# Patient Record
Sex: Female | Born: 1935 | Race: White | Hispanic: No | Marital: Married | ZIP: H3Z | Smoking: Never smoker
Health system: Southern US, Community
[De-identification: ages and names within clinical notes are randomized; demographics above are authoritative.]

## PROBLEM LIST (undated history)

## (undated) DIAGNOSIS — E7800 Pure hypercholesterolemia, unspecified: Secondary | ICD-10-CM

## (undated) DIAGNOSIS — G459 Transient cerebral ischemic attack, unspecified: Secondary | ICD-10-CM

## (undated) DIAGNOSIS — K219 Gastro-esophageal reflux disease without esophagitis: Secondary | ICD-10-CM

## (undated) DIAGNOSIS — I10 Essential (primary) hypertension: Secondary | ICD-10-CM

---

## 2014-04-15 ENCOUNTER — Encounter (HOSPITAL_BASED_OUTPATIENT_CLINIC_OR_DEPARTMENT_OTHER): Payer: Self-pay | Admitting: Emergency Medicine

## 2014-04-15 ENCOUNTER — Emergency Department (HOSPITAL_BASED_OUTPATIENT_CLINIC_OR_DEPARTMENT_OTHER)
Admission: EM | Admit: 2014-04-15 | Discharge: 2014-04-15 | Disposition: A | Discharge status: Home / Self Care | Payer: PRIVATE HEALTH INSURANCE | Attending: Emergency Medicine | Admitting: Emergency Medicine

## 2014-04-15 ENCOUNTER — Emergency Department (HOSPITAL_BASED_OUTPATIENT_CLINIC_OR_DEPARTMENT_OTHER): Payer: PRIVATE HEALTH INSURANCE

## 2014-04-15 ENCOUNTER — Other Ambulatory Visit: Payer: Self-pay

## 2014-04-15 DIAGNOSIS — Z8673 Personal history of transient ischemic attack (TIA), and cerebral infarction without residual deficits: Secondary | ICD-10-CM | POA: Diagnosis not present

## 2014-04-15 DIAGNOSIS — Z79899 Other long term (current) drug therapy: Secondary | ICD-10-CM | POA: Insufficient documentation

## 2014-04-15 DIAGNOSIS — Z862 Personal history of diseases of the blood and blood-forming organs and certain disorders involving the immune mechanism: Secondary | ICD-10-CM | POA: Insufficient documentation

## 2014-04-15 DIAGNOSIS — I10 Essential (primary) hypertension: Secondary | ICD-10-CM | POA: Diagnosis not present

## 2014-04-15 DIAGNOSIS — R0602 Shortness of breath: Secondary | ICD-10-CM | POA: Diagnosis not present

## 2014-04-15 DIAGNOSIS — R110 Nausea: Secondary | ICD-10-CM | POA: Insufficient documentation

## 2014-04-15 DIAGNOSIS — Z7902 Long term (current) use of antithrombotics/antiplatelets: Secondary | ICD-10-CM | POA: Insufficient documentation

## 2014-04-15 DIAGNOSIS — R079 Chest pain, unspecified: Secondary | ICD-10-CM | POA: Diagnosis not present

## 2014-04-15 DIAGNOSIS — Z8719 Personal history of other diseases of the digestive system: Secondary | ICD-10-CM | POA: Diagnosis not present

## 2014-04-15 DIAGNOSIS — Z8639 Personal history of other endocrine, nutritional and metabolic disease: Secondary | ICD-10-CM | POA: Insufficient documentation

## 2014-04-15 HISTORY — DX: Essential (primary) hypertension: I10

## 2014-04-15 HISTORY — DX: Gastro-esophageal reflux disease without esophagitis: K21.9

## 2014-04-15 HISTORY — DX: Transient cerebral ischemic attack, unspecified: G45.9

## 2014-04-15 HISTORY — DX: Pure hypercholesterolemia, unspecified: E78.00

## 2014-04-15 LAB — CBC WITH DIFFERENTIAL/PLATELET
Basophils Absolute: 0 10*3/uL (ref 0.0–0.1)
Basophils Relative: 1 % (ref 0–1)
Eosinophils Absolute: 0.3 10*3/uL (ref 0.0–0.7)
Eosinophils Relative: 3 % (ref 0–5)
HCT: 43.1 % (ref 36.0–46.0)
HEMOGLOBIN: 14.5 g/dL (ref 12.0–15.0)
LYMPHS PCT: 22 % (ref 12–46)
Lymphs Abs: 1.9 10*3/uL (ref 0.7–4.0)
MCH: 30.2 pg (ref 26.0–34.0)
MCHC: 33.6 g/dL (ref 30.0–36.0)
MCV: 89.8 fL (ref 78.0–100.0)
Monocytes Absolute: 0.6 10*3/uL (ref 0.1–1.0)
Monocytes Relative: 7 % (ref 3–12)
NEUTROS ABS: 5.8 10*3/uL (ref 1.7–7.7)
Neutrophils Relative %: 68 % (ref 43–77)
Platelets: 243 10*3/uL (ref 150–400)
RBC: 4.8 MIL/uL (ref 3.87–5.11)
RDW: 14 % (ref 11.5–15.5)
WBC: 8.5 10*3/uL (ref 4.0–10.5)

## 2014-04-15 LAB — BASIC METABOLIC PANEL
BUN: 20 mg/dL (ref 6–23)
CO2: 24 mEq/L (ref 19–32)
CREATININE: 0.8 mg/dL (ref 0.50–1.10)
Calcium: 10 mg/dL (ref 8.4–10.5)
Chloride: 103 mEq/L (ref 96–112)
GFR calc Af Amer: 81 mL/min — ABNORMAL LOW (ref >90)
GFR calc non Af Amer: 70 mL/min — ABNORMAL LOW (ref >90)
Glucose, Bld: 89 mg/dL (ref 70–99)
Potassium: 4.1 mEq/L (ref 3.7–5.3)
Sodium: 140 mEq/L (ref 137–147)

## 2014-04-15 LAB — TROPONIN I
Troponin I: <0.3 ng/mL (ref <0.30)
Troponin I: <0.3 ng/mL (ref <0.30)

## 2014-04-15 MED ORDER — NITROGLYCERIN 0.4 MG SL SUBL
0.4000 mg | SUBLINGUAL_TABLET | SUBLINGUAL | Status: DC | PRN
  Administered 2014-04-15: 0.4 mg via SUBLINGUAL
  Filled 2014-04-15: qty 1

## 2014-04-15 MED ORDER — ASPIRIN 325 MG PO TABS
325.0000 mg | ORAL_TABLET | Freq: Once | ORAL | Status: AC
  Administered 2014-04-15: 325 mg via ORAL
  Filled 2014-04-15: qty 1

## 2014-04-15 NOTE — Discharge Instructions (Signed)
Chest Pain (Nonspecific) °It is often hard to give a specific diagnosis for the cause of chest pain. There is always a chance that your pain could be related to something serious, such as a heart attack or a blood clot in the lungs. You need to follow up with your caregiver for further evaluation. °CAUSES  °· Heartburn. °· Pneumonia or bronchitis. °· Anxiety or stress. °· Inflammation around your heart (pericarditis) or lung (pleuritis or pleurisy). °· A blood clot in the lung. °· A collapsed lung (pneumothorax). It can develop suddenly on its own (spontaneous pneumothorax) or from injury (trauma) to the chest. °· Shingles infection (herpes zoster virus). °The chest wall is composed of bones, muscles, and cartilage. Any of these can be the source of the pain. °· The bones can be bruised by injury. °· The muscles or cartilage can be strained by coughing or overwork. °· The cartilage can be affected by inflammation and become sore (costochondritis). °DIAGNOSIS  °Lab tests or other studies, such as X-rays, electrocardiography, stress testing, or cardiac imaging, may be needed to find the cause of your pain.  °TREATMENT  °· Treatment depends on what may be causing your chest pain. Treatment may include: °· Acid blockers for heartburn. °· Anti-inflammatory medicine. °· Pain medicine for inflammatory conditions. °· Antibiotics if an infection is present. °· You may be advised to change lifestyle habits. This includes stopping smoking and avoiding alcohol, caffeine, and chocolate. °· You may be advised to keep your head raised (elevated) when sleeping. This reduces the chance of acid going backward from your stomach into your esophagus. °· Most of the time, nonspecific chest pain will improve within 2 to 3 days with rest and mild pain medicine. °HOME CARE INSTRUCTIONS  °· If antibiotics were prescribed, take your antibiotics as directed. Finish them even if you start to feel better. °· For the next few days, avoid physical  activities that bring on chest pain. Continue physical activities as directed. °· Do not smoke. °· Avoid drinking alcohol. °· Only take over-the-counter or prescription medicine for pain, discomfort, or fever as directed by your caregiver. °· Follow your caregiver's suggestions for further testing if your chest pain does not go away. °· Keep any follow-up appointments you made. If you do not go to an appointment, you could develop lasting (chronic) problems with pain. If there is any problem keeping an appointment, you must call to reschedule. °SEEK MEDICAL CARE IF:  °· You think you are having problems from the medicine you are taking. Read your medicine instructions carefully. °· Your chest pain does not go away, even after treatment. °· You develop a rash with blisters on your chest. °SEEK IMMEDIATE MEDICAL CARE IF:  °· You have increased chest pain or pain that spreads to your arm, neck, jaw, back, or abdomen. °· You develop shortness of breath, an increasing cough, or you are coughing up blood. °· You have severe back or abdominal pain, feel nauseous, or vomit. °· You develop severe weakness, fainting, or chills. °· You have a fever. °THIS IS AN EMERGENCY. Do not wait to see if the pain will go away. Get medical help at once. Call your local emergency services (911 in [place] Do not drive yourself to the hospital. °MAKE SURE YOU:  °· Understand these instructions. °· Will watch your condition. °· Will get help right away if you are not doing well or get worse. °Document Released: 07/30/2005 Document Revised: 01/12/2012 Document Reviewed: 05/25/2008 °[employer]® Patient Information ©2014 [employer]. ° °

## 2014-04-15 NOTE — ED Notes (Signed)
MD at bedside. 

## 2014-04-15 NOTE — ED Notes (Signed)
Pt. Ambulated to rest room 

## 2014-04-15 NOTE — ED Provider Notes (Signed)
CSN: [id number]     Arrival date & time 04/15/14  1141 History   First MD Initiated Contact with Patient 04/15/14 1152     Chief Complaint  Patient presents with  . Chest Pain      HPI Patient presents to the emergency department after developing left neck left shoulder left anterior chest pain with associated nausea and shortness of breath.  She reports she had some discomfort up in her left jaw as well.  This lasted for approximately 20 minutes.  She presents the ER for evaluation.  She has no prior history of cardiac disease.  She does have a history of TIA.  She is on Plavix.  She has a history of hypertension hyperlipidemia and a family history of heart disease.  She states she has had several stress tests in the past which demonstrated no pathology.  She lives in [place] and [place].  She denies shortness of breath at this time.  No fevers or chills.  No cough or congestion.  No recent injury or trauma.  She states her discomfort is significantly improved.  She continues has just some very mild pain of her bilateral.  No weakness of her arms or legs.  No significant headache at this time.  She does not smoke cigarettes.  She states normally she works out with a Psychologist, educationaltrainer and walks on the treadmill and never develops discomfort in her neck or jaw or shoulder or chest.  However she has not really been working out over the past month that she had a recent cold that seemed to keep her less active.   Past Medical History  Diagnosis Date  . TIA (transient ischemic attack)   . GERD (gastroesophageal reflux disease)   . High cholesterol   . Hypertension    History reviewed. No pertinent past surgical history. No family history on file. History  Substance Use Topics  . Smoking status: Never Smoker   . Smokeless tobacco: Not on file  . Alcohol Use: Not on file   OB History   Grav Para Term Preterm Abortions TAB SAB Ect Mult Living                 Review of  Systems  All other systems reviewed and are negative.     Allergies  Iodinated diagnostic agents; Iohexol; and Sulfa antibiotics  Home Medications   Prior to Admission medications   Medication Sig Start Date End Date Taking? Authorizing Provider  clopidogrel (PLAVIX) 75 MG tablet Take 75 mg by mouth daily with breakfast.   Yes Historical Provider, MD  folic acid (FOLVITE) 1 MG tablet Take 1 mg by mouth daily.   Yes Historical Provider, MD  perindopril (ACEON) 2 MG tablet Take 2 mg by mouth daily.   Yes Historical Provider, MD   BP 150/74  Pulse 69  Temp(Src) 97.7 F (36.5 C) (Oral)  Resp 18  SpO2 99% Physical Exam  Nursing note and vitals reviewed. Constitutional: She is oriented to person, place, and time. She appears well-developed and well-nourished. No distress.  HENT:  Head: Normocephalic and atraumatic.  No carotid bruits present  Eyes: EOM are normal.  Neck: Normal range of motion.  Cardiovascular: Normal rate, regular rhythm and normal heart sounds.   Pulmonary/Chest: Effort normal and breath sounds normal.  Abdominal: Soft. She exhibits no distension. There is no tenderness.  Musculoskeletal: Normal range of motion.  Neurological: She is alert and oriented to person, place, and  time.  Skin: Skin is warm and dry.  Psychiatric: She has a normal mood and affect. Judgment normal.    ED Course  Procedures (including critical care time) Labs Review Labs Reviewed  BASIC METABOLIC PANEL - Abnormal; Notable for the following:    GFR calc non Af Amer 70 (*)    GFR calc Af Amer 81 (*)    All other components within normal limits  CBC WITH DIFFERENTIAL  TROPONIN I    Imaging Review Dg Chest 2 View  04/15/2014   CLINICAL DATA:  Chest pain.  EXAM: CHEST  2 VIEW  COMPARISON:  None.  FINDINGS: Normal heart size and mediastinal contours. No acute infiltrate or edema. No effusion or pneumothorax. No acute osseous findings. Cholecystectomy changes  IMPRESSION: No active  cardiopulmonary disease.   Electronically Signed   By: Tiburcio PeaJonathan  Watts M.D.   On: 04/15/2014 13:28  I personally reviewed the imaging tests through PACS system I reviewed available ER/hospitalization records through the EMR   ECG interpretation 1159am  Date: 04/15/2014  Rate: 69  Rhythm: normal sinus rhythm  QRS Axis: normal  Intervals: normal  ST/T Wave abnormalities: normal  Conduction Disutrbances: none  Narrative Interpretation:   Old EKG Reviewed: no prior ecg   ECG interpretation 1401pm  Date: 04/15/2014  Rate: 62  Rhythm: normal sinus rhythm  QRS Axis: normal  Intervals: normal  ST/T Wave abnormalities: normal  Conduction Disutrbances: none  Narrative Interpretation:   Old EKG Reviewed: No significant changes noted        MDM   Final diagnoses:  None   Patient has a heart score of 4-5.  She had some typical features and risk factors and age of 78.  As the wife initially were not very interested in staying overnight.  Currently they are discussing this.  I made the recommendation that we observe her overnight and have cardiology see her.  She has had 2 EKGs which have been normal emergency department.  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  If it was present the patient was asymptomatic as she felt no palpitations and had no near-syncope.  The family understands as does the patient that if she were to desire the remainder of her cardiac workup as an outpatient with her cardiology team in [place] that there is some risk to this.  They've been given the heart score data and we have sat down and spoke about this at length.  They each had a copy of her heart score and her risk of major adverse cardiac event.  I spent approximately 20-30 minutes at the patient's bedside with her and her husband  discussing these results.  I will allow them to make an informed decision.  Patient will get a second set of cardiac enzymes now and the patient and her spouse will decide as to whether or not they would like to follow her recommendations and be observed overnight or go home.  If she agrees to and desires admission I would consult cardiology for this patient who has typical symptoms and strong family history.  She is pain-free at this time.  Aspirin was given.   Care to Dr Judd Lienelo    Lyanne CoKevin M Lajada Janes, MD 04/15/14 [phone]

## 2015-12-11 IMAGING — CR DG CHEST 2V
2 series · 2 of 2 positions shown · non-contrast
Comparison: None.

CLINICAL DATA: Chest pain.

EXAM:
CHEST  2 VIEW

[w chest pa]
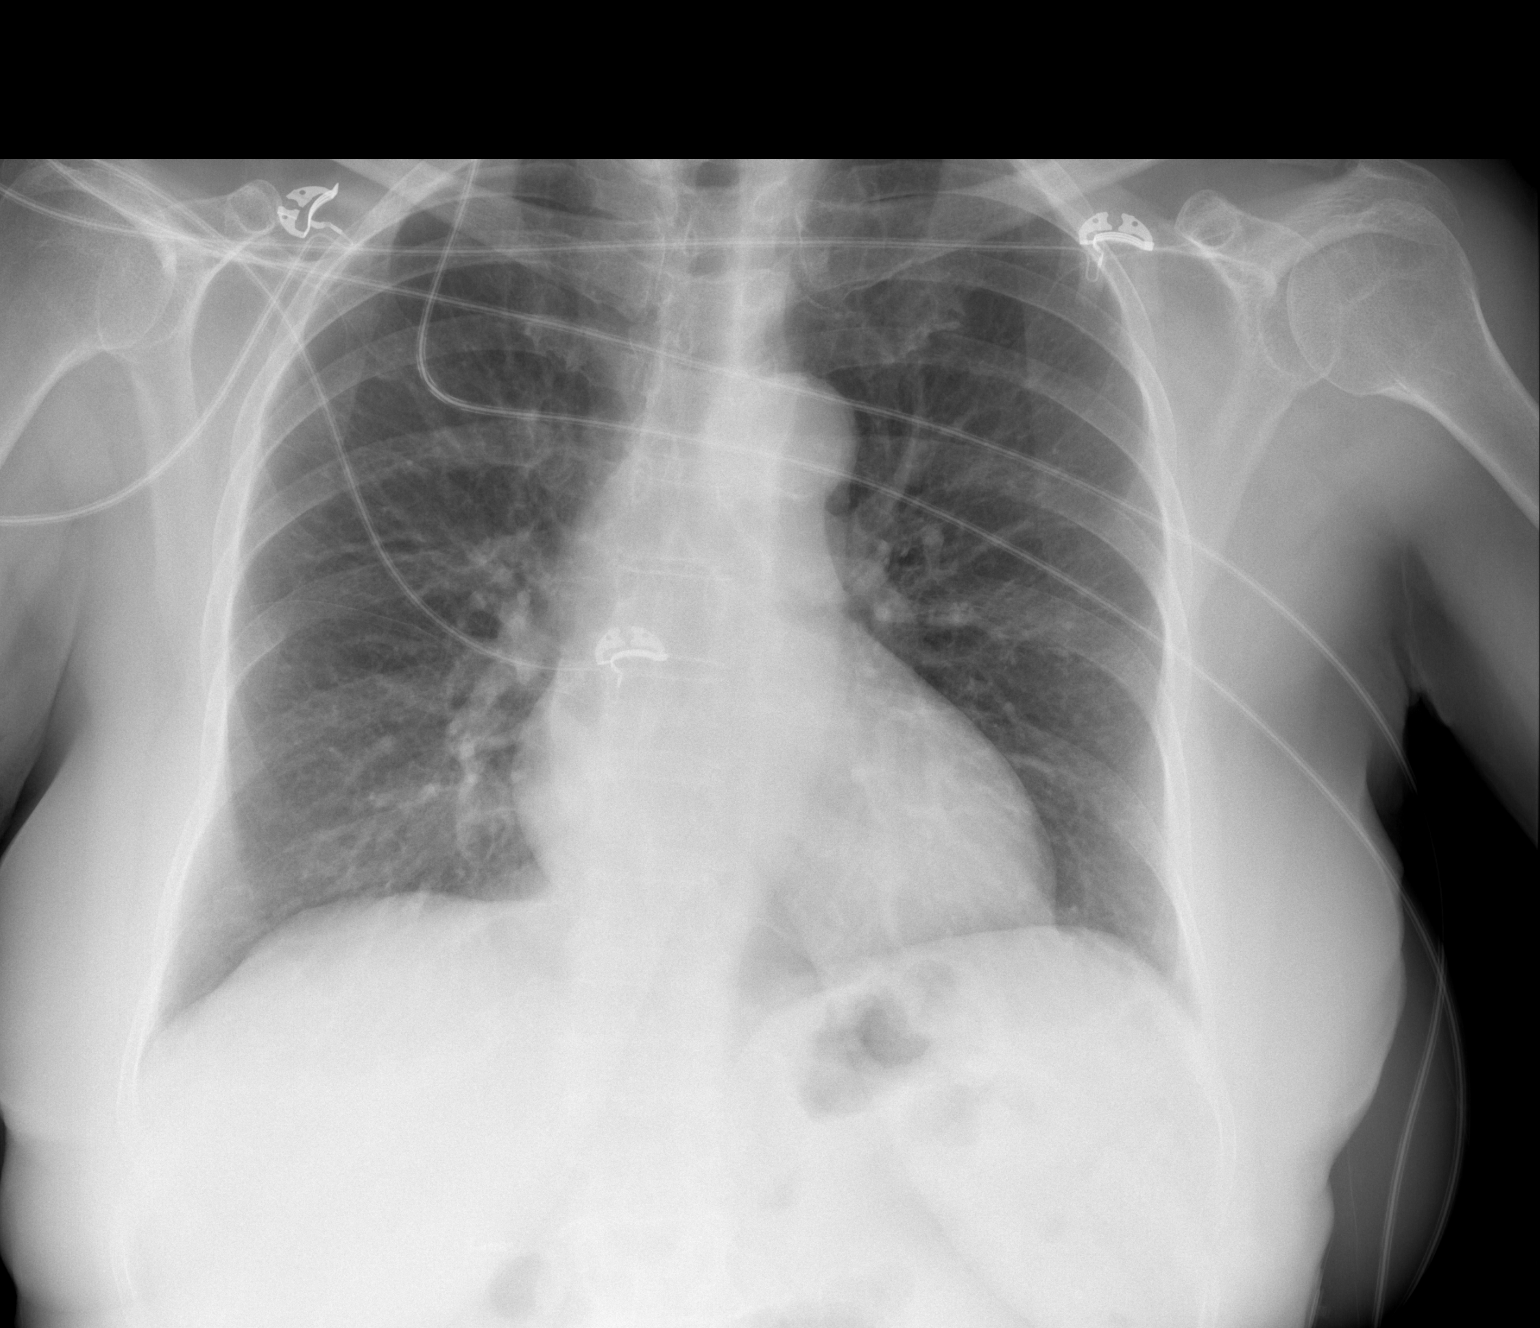

[w chest lat]
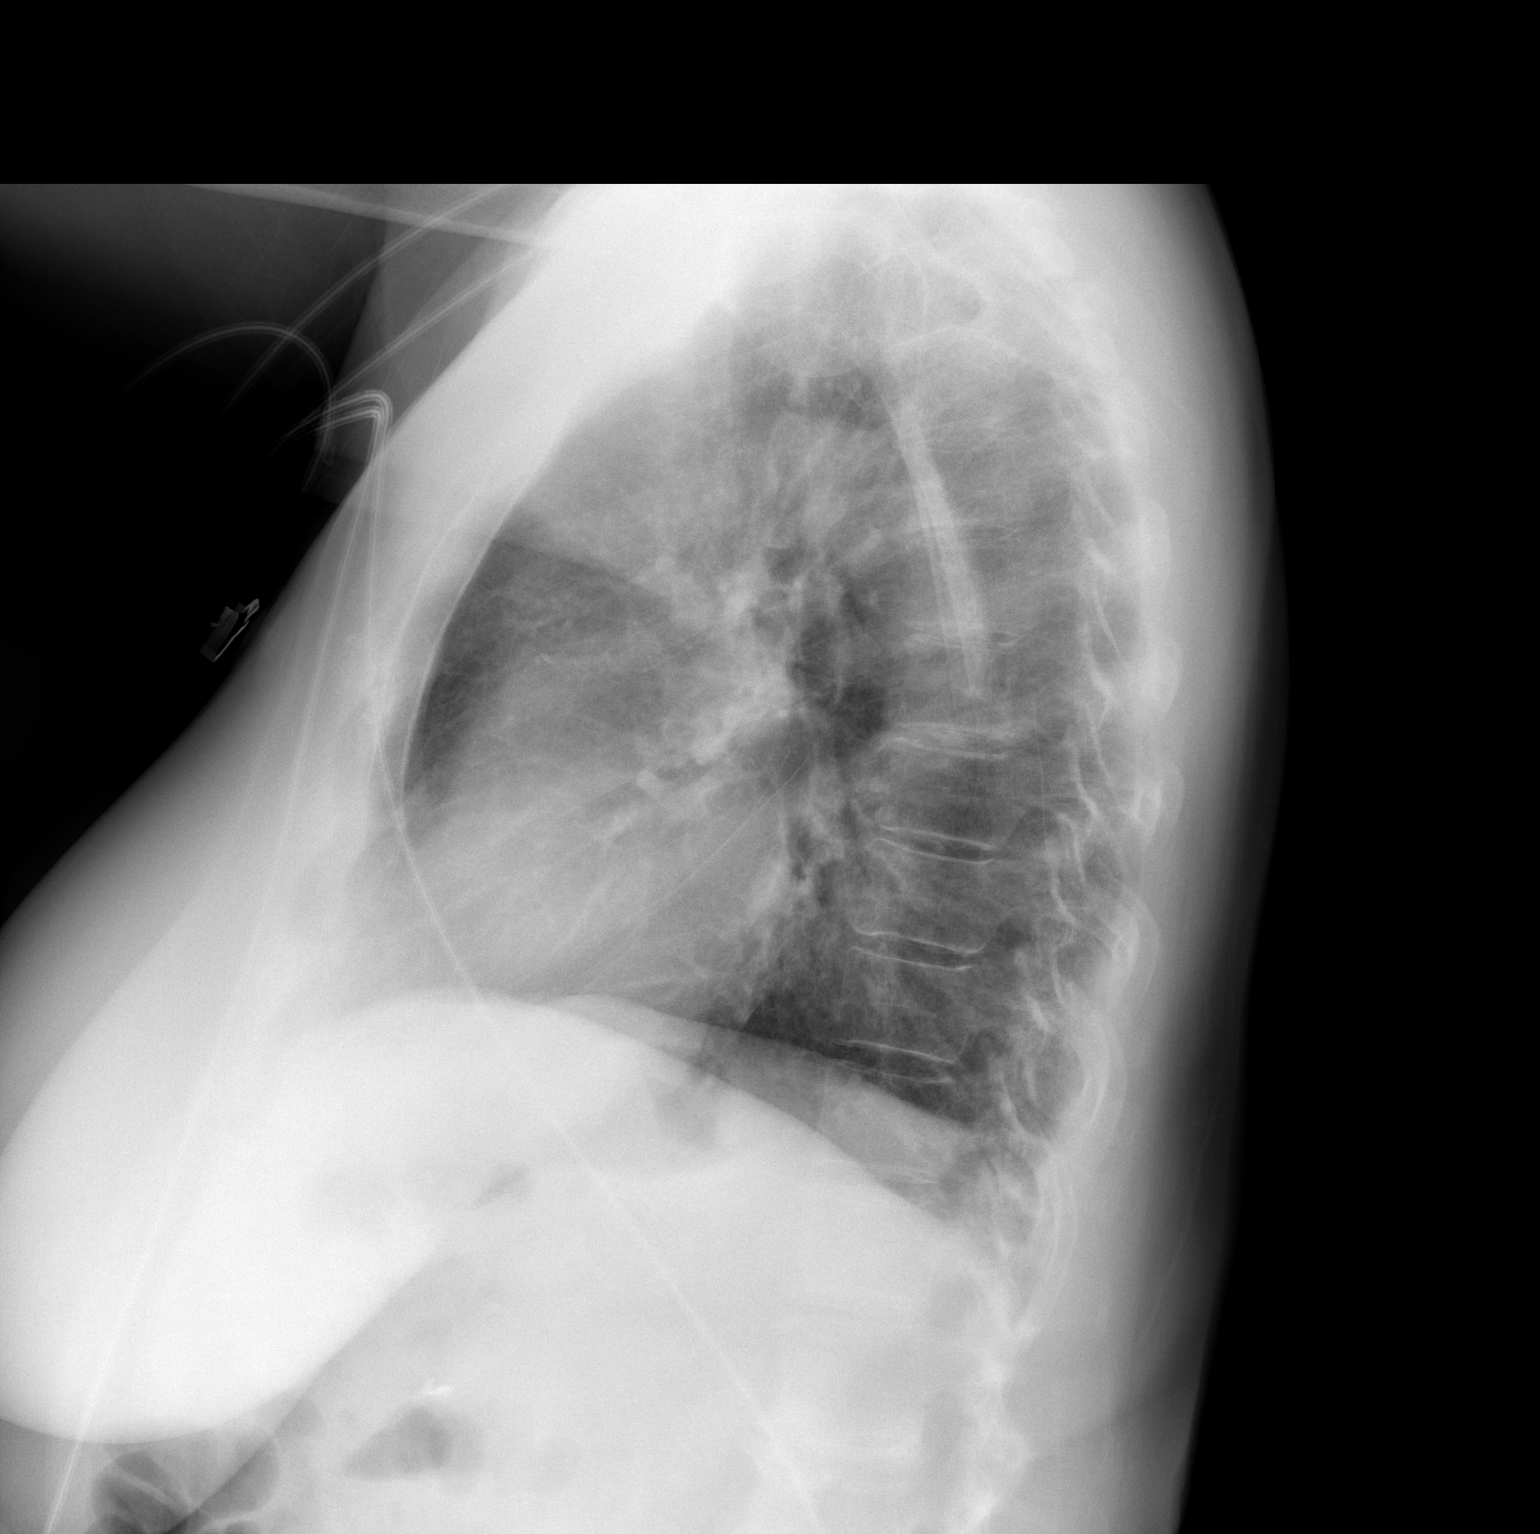

[2 of 2 positions shown; findings below may reference images not displayed]

FINDINGS: Normal heart size and mediastinal contours. No acute infiltrate or
edema. No effusion or pneumothorax. No acute osseous findings.
Cholecystectomy changes
IMPRESSION: No active cardiopulmonary disease.
